# Patient Record
Sex: Female | Born: 2000 | Race: Black or African American | Hispanic: No | Marital: Single | Smoking: Never smoker
Health system: Southern US, Community
[De-identification: ages and names within clinical notes are randomized; demographics above are authoritative.]

## PROBLEM LIST (undated history)

## (undated) DIAGNOSIS — T7840XA Allergy, unspecified, initial encounter: Secondary | ICD-10-CM

## (undated) DIAGNOSIS — L309 Dermatitis, unspecified: Secondary | ICD-10-CM

## (undated) HISTORY — DX: Allergy, unspecified, initial encounter: T78.40XA

---
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
## 2019-05-14 DIAGNOSIS — L2084 Intrinsic (allergic) eczema: Secondary | ICD-10-CM | POA: Diagnosis not present

## 2019-05-15 NOTE — Progress Notes (Signed)
Subjective:    Patient ID: Holly Velez, female    DOB: 05/07/2000, 19 y.o.   MRN: [id number]  HPI:  Holly Velez is here to establish as a new pt.  She is a pleasant 19 year old female. PMH:  Seasonal allergies- treated with OTC Loratadine 10mg  QD She also reports "heart fluttering" that has increased the last 12 months (initially first started at age 39). She reports fluttering will occur twice monthly, last a few seconds. Sx's will more commonly occur in afternoon. She reports drinking only one cup of coffee/day. She reports hx of chronic anxiety. She has never been on medication for GAD. She denies chest pain or tightness with exertion. She denies family hx of CAD/MI/CVA, however her mother is experiencing similar sx's and is pursuing a cardiac workup. She practices yoga daily. She denies tobacco/vape/ETOH use. She reports feeling anxious at this appt, and states "I have never been to the doctor without my mother before". She denies palpitations at present   Patient Care Team    Relationship Specialty Notifications Start End  4, NP PCP - General Family Medicine  05/16/19     Patient Active Problem List   Diagnosis Date Noted  . Healthcare maintenance 05/16/2019  . Intermittent palpitations 05/16/2019     Past Medical History:  Diagnosis Date  . Allergy   . Eczema      History reviewed. No pertinent surgical history.   Family History  Problem Relation Age of Onset  . Hypertension Father   . Diabetes Maternal Grandmother      Social History   Substance and Sexual Activity  Drug Use No     Social History   Substance and Sexual Activity  Alcohol Use No     Social History   Tobacco Use  Smoking Status Never Smoker  Smokeless Tobacco Never Used     Outpatient Encounter Medications as of 05/16/2019  Medication Sig  . loratadine (CLARITIN) 10 MG tablet Take 10 mg by mouth daily.  . Multiple Vitamin (MULTIVITAMIN) tablet Take 1  tablet by mouth daily.  . [DISCONTINUED] cetirizine (ZYRTEC) 5 MG tablet Take 5 mg by mouth daily as needed. For allergies  . [DISCONTINUED] HYDRALAZINE HCL PO Take 10 mLs by mouth 3 (three) times daily.  . [DISCONTINUED] Olopatadine HCl (PATADAY OP) Place 1 drop into both eyes daily.  . [DISCONTINUED] prednisoLONE (ORAPRED) 15 MG/5ML solution 15 mls po qd days 1-2, 12 mls po days 3-4, 9 mls po days 5-6, 6 mls po days 7-8, 3 mls po days 9-10, then stop  . [DISCONTINUED] PRESCRIPTION MEDICATION Take by mouth 3 (three) times daily. antiobiotic started a week ago. For 1 week. Mom does not know name and pharmacy is closed. Doesn't know for how long was told to quit when all  Bottled where finished. Said the name of med begins with "L"  . [DISCONTINUED] PRESCRIPTION MEDICATION Apply 1 application topically daily as needed. For itching of rash. Prescription steroid cream. Mom does not know name of it   No facility-administered encounter medications on file as of 05/16/2019.    Allergies: Patient has no known allergies.  Body mass index is 31.92 kg/m.  Blood pressure 118/77, pulse 92, temperature 99.2 F (37.3 C), temperature source Oral, height 5' 0.5" (1.537 m), weight 166 lb 3.2 oz (75.4 kg), last menstrual period 05/13/2019, SpO2 100 %.     Review of Systems  Constitutional: Negative for activity change, appetite change, chills, diaphoresis, fatigue, fever  and unexpected weight change.  HENT: Negative for congestion.   Eyes: Negative for visual disturbance.  Respiratory: Negative for cough, chest tightness, shortness of breath, wheezing and stridor.   Cardiovascular: Positive for palpitations. Negative for chest pain and leg swelling.  Gastrointestinal: Negative for abdominal distention, abdominal pain, blood in stool, constipation, diarrhea, nausea and vomiting.  Endocrine: Negative for polydipsia, polyphagia and polyuria.  Genitourinary: Negative for difficulty urinating and frequency.   Musculoskeletal: Negative for arthralgias, back pain, gait problem, joint swelling, myalgias, neck pain and neck stiffness.  Neurological: Negative for dizziness and headaches.  Hematological: Negative for adenopathy. Does not bruise/bleed easily.  Psychiatric/Behavioral: Negative for agitation, behavioral problems, confusion, decreased concentration, dysphoric mood, hallucinations, self-injury, sleep disturbance and suicidal ideas. The patient is nervous/anxious. The patient is not hyperactive.        Objective:   Physical Exam Vitals and nursing note reviewed.  Constitutional:      General: She is not in acute distress.    Appearance: Normal appearance. She is obese. She is not ill-appearing, toxic-appearing or diaphoretic.  Cardiovascular:     Rate and Rhythm: Normal rate and regular rhythm.     Pulses: Normal pulses.     Heart sounds: Normal heart sounds. No murmur. No friction rub. No gallop.   Pulmonary:     Effort: Pulmonary effort is normal. No respiratory distress.     Breath sounds: Normal breath sounds. No stridor. No wheezing, rhonchi or rales.  Chest:     Chest wall: No tenderness.  Skin:    Capillary Refill: Capillary refill takes less than 2 seconds.  Neurological:     Mental Status: She is alert and oriented to person, place, and time.     Coordination: Coordination normal.  Psychiatric:        Attention and Perception: Attention normal.        Mood and Affect: Mood is anxious.        Speech: Speech is rapid and pressured.        Behavior: Behavior normal.        Thought Content: Thought content normal.        Cognition and Memory: Cognition and memory normal.        Judgment: Judgment normal.        Assessment & Plan:   1. Fluttering sensation of heart   2. Healthcare maintenance   3. Intermittent palpitations     Intermittent palpitations TSH, Free T4, T3, CMP, CBC drawn Avoid caffeine Continue daily yoga Holter Study ordered Discussed Red Flag  sx's to seek immediate medical assistance if any develop.  Healthcare maintenance We will call you when lab results are available. Holter Study order- we will contact you with results. Continue to social distance and wear a mask. Follow-up 6 weeks, re: palpitations.    FOLLOW-UP:  Return in 6 weeks (on 06/27/2019) for Palpitations.

## 2019-05-16 ENCOUNTER — Telehealth: Payer: Self-pay | Admitting: Radiology

## 2019-05-16 ENCOUNTER — Encounter: Payer: Self-pay | Admitting: Adult Health

## 2019-05-16 ENCOUNTER — Other Ambulatory Visit: Payer: Self-pay

## 2019-05-16 ENCOUNTER — Telehealth: Payer: Self-pay | Admitting: *Deleted

## 2019-05-16 ENCOUNTER — Ambulatory Visit (INDEPENDENT_AMBULATORY_CARE_PROVIDER_SITE_OTHER): Payer: BC Managed Care – PPO | Admitting: Adult Health

## 2019-05-16 VITALS — BP 118/77 | HR 92 | Temp 99.2°F | Ht 60.5 in | Wt 166.2 lb

## 2019-05-16 DIAGNOSIS — Z0000 Encounter for general adult medical examination without abnormal findings: Secondary | ICD-10-CM

## 2019-05-16 DIAGNOSIS — R002 Palpitations: Secondary | ICD-10-CM | POA: Insufficient documentation

## 2019-05-16 NOTE — Telephone Encounter (Signed)
error 

## 2019-05-16 NOTE — Telephone Encounter (Signed)
Patient enrolled for [employer] to mail a 3 day ZIO XT long term holter monitor.  Instructions reviewed briefly as they are included in the monitor kit.

## 2019-05-16 NOTE — Assessment & Plan Note (Signed)
TSH, Free T4, T3, CMP, CBC drawn Avoid caffeine Continue daily yoga Holter Study ordered Discussed Red Flag sx's to seek immediate medical assistance if any develop.

## 2019-05-16 NOTE — Patient Instructions (Addendum)
Mediterranean Diet A Mediterranean diet refers to food and lifestyle choices that are based on the traditions of countries located on the [employer]. This way of eating has been shown to help prevent certain conditions and improve outcomes for people who have chronic diseases, like kidney disease and heart disease. What are tips for following this plan? Lifestyle  Cook and eat meals together with your family, when possible.  Drink enough fluid to keep your urine clear or pale yellow.  Be physically active every day. This includes: ? Aerobic exercise like running or swimming. ? Leisure activities like gardening, walking, or housework.  Get 7-8 hours of sleep each night.  Reading food labels   Check the serving size of packaged foods. For foods such as rice and pasta, the serving size refers to the amount of cooked product, not dry.  Check the total fat in packaged foods. Avoid foods that have saturated fat or trans fats.  Check the ingredients list for added sugars, such as corn syrup. Shopping  At the grocery store, buy most of your food from the areas near the walls of the store. This includes: ? Fresh fruits and vegetables (produce). ? Grains, beans, nuts, and seeds. Some of these may be available in unpackaged forms or large amounts (in bulk). ? Fresh seafood. ? Poultry and eggs. ? Low-fat dairy products.  Buy whole ingredients instead of prepackaged foods.  Buy fresh fruits and vegetables in-season from local farmers markets.  Buy frozen fruits and vegetables in resealable bags.  If you do not have access to quality fresh seafood, buy precooked frozen shrimp or canned fish, such as tuna, salmon, or sardines.  Buy small amounts of raw or cooked vegetables, salads, or olives from the deli or salad bar at your store.  Stock your pantry so you always have certain foods on hand, such as olive oil, canned tuna, canned tomatoes, rice, pasta, and beans. Cooking  Cook  foods with extra-virgin olive oil instead of using butter or other vegetable oils.  Have meat as a side dish, and have vegetables or grains as your main dish. This means having meat in small portions or adding small amounts of meat to foods like pasta or stew.  Use beans or vegetables instead of meat in common dishes like chili or lasagna.  Experiment with different cooking methods. Try roasting or broiling vegetables instead of steaming or sauteing them.  Add frozen vegetables to soups, stews, pasta, or rice.  Add nuts or seeds for added healthy fat at each meal. You can add these to yogurt, salads, or vegetable dishes.  Marinate fish or vegetables using olive oil, lemon juice, garlic, and fresh herbs. Meal planning   Plan to eat 1 vegetarian meal one day each week. Try to work up to 2 vegetarian meals, if possible.  Eat seafood 2 or more times a week.  Have healthy snacks readily available, such as: ? Vegetable sticks with hummus. ? [place] yogurt. ? Fruit and nut trail mix.  Eat balanced meals throughout the week. This includes: ? Fruit: 2-3 servings a day ? Vegetables: 4-5 servings a day ? Low-fat dairy: 2 servings a day ? Fish, poultry, or lean meat: 1 serving a day ? Beans and legumes: 2 or more servings a week ? Nuts and seeds: 1-2 servings a day ? Whole grains: 6-8 servings a day ? Extra-virgin olive oil: 3-4 servings a day  Limit red meat and sweets to only a few servings a month What  are my food choices?  Mediterranean diet ? Recommended  Grains: Whole-grain pasta. Brown rice. Bulgar wheat. Polenta. Couscous. Whole-wheat bread. Orpah Cobb.  Vegetables: Artichokes. Beets. Broccoli. Cabbage. Carrots. Eggplant. Green beans. Chard. Kale. Spinach. Onions. Leeks. Peas. Squash. Tomatoes. Peppers. Radishes.  Fruits: Apples. Apricots. Avocado. Berries. Bananas. Cherries. Dates. Figs. Grapes. Lemons. Melon. Oranges. Peaches. Plums. Pomegranate.  Meats and other  protein foods: Beans. Almonds. Sunflower seeds. Pine nuts. Peanuts. Cod. Salmon. Scallops. Shrimp. Tuna. Tilapia. Clams. Oysters. Eggs.  Dairy: Low-fat milk. Cheese. Greek yogurt.  Beverages: Water. Red wine. Herbal tea.  Fats and oils: Extra virgin olive oil. Avocado oil. Grape seed oil.  Sweets and desserts: [place] yogurt with honey. Baked apples. Poached pears. Trail mix.  Seasoning and other foods: Basil. Cilantro. Coriander. Cumin. Mint. Parsley. Sage. Rosemary. Tarragon. Garlic. Oregano. Thyme. Pepper. Balsalmic vinegar. Tahini. Hummus. Tomato sauce. Olives. Mushrooms. ? Limit these  Grains: Prepackaged pasta or rice dishes. Prepackaged cereal with added sugar.  Vegetables: Deep fried potatoes (french fries).  Fruits: Fruit canned in syrup.  Meats and other protein foods: Beef. Pork. Lamb. Poultry with skin. Hot dogs. Tomasa Blase.  Dairy: Ice cream. Sour cream. Whole milk.  Beverages: Juice. Sugar-sweetened soft drinks. Beer. Liquor and spirits.  Fats and oils: Butter. Canola oil. Vegetable oil. Beef fat (tallow). Lard.  Sweets and desserts: Cookies. Cakes. Pies. Candy.  Seasoning and other foods: Mayonnaise. Premade sauces and marinades. The items listed may not be a complete list. Talk with your dietitian about what dietary choices are right for you. Summary  The Mediterranean diet includes both food and lifestyle choices.  Eat a variety of fresh fruits and vegetables, beans, nuts, seeds, and whole grains.  Limit the amount of red meat and sweets that you eat.  This information is not intended to replace advice given to you by your health care provider. Make sure you discuss any questions you have with your health care provider. Document Revised: 11/26/2015 Document Reviewed: 11/19/2015 Elsevier Patient Education  2020 [employer].   Palpitations Palpitations are feelings that your heartbeat is not normal. Your heartbeat may feel like it is:  Uneven.  Faster than  normal.  Fluttering.  Skipping a beat. This is usually not a serious problem. In some cases, you may need tests to rule out any serious problems. Follow these instructions at home: Pay attention to any changes in your condition. Take these actions to help manage your symptoms: Eating and drinking  Avoid: ? Coffee, tea, soft drinks, and energy drinks. ? Chocolate. ? Alcohol. ? Diet pills. Lifestyle   Try to lower your stress. These things can help you relax: ? Yoga. ? Deep breathing and meditation. ? Exercise. ? Using words and images to create positive thoughts (guided imagery). ? Using your mind to control things in your body (biofeedback).  Do not use drugs.  Get plenty of rest and sleep. Keep a regular bed time. General instructions   Take over-the-counter and prescription medicines only as told by your doctor.  Do not use any products that contain nicotine or tobacco, such as cigarettes and e-cigarettes. If you need help quitting, ask your doctor.  Keep all follow-up visits as told by your doctor. This is important. You may need more tests if palpitations do not go away or get worse. Contact a doctor if:  Your symptoms last more than 24 hours.  Your symptoms occur more often. Get help right away if you:  Have chest pain.  Feel short of breath.  Have  a very bad headache.  Feel dizzy.  Pass out (faint). Summary  Palpitations are feelings that your heartbeat is uneven or faster than normal. It may feel like your heart is fluttering or skipping a beat.  Avoid food and drinks that may cause palpitations. These include caffeine, chocolate, and alcohol.  Try to lower your stress. Do not smoke or use drugs.  Get help right away if you faint or have chest pain, shortness of breath, a severe headache, or dizziness. This information is not intended to replace advice given to you by your health care provider. Make sure you discuss any questions you have with your  health care provider. Document Revised: 05/10/2017 Document Reviewed: 05/10/2017 Elsevier Patient Education  [employer].  We will call you when lab results are available. Holter Study order- we will contact you with results. Continue to social distance and wear a mask. Follow-up 6 weeks, re: palpitations. WELCOME TO THE PRACTICE!

## 2019-05-16 NOTE — Telephone Encounter (Deleted)
Enrolled patient for a 3 day Zio monitor to be mailed to patients home. Brief instructions were gone over with patient and she knows to expect the monitor to arrive in 5-7 days.

## 2019-05-17 LAB — CBC WITH DIFFERENTIAL/PLATELET
Basophils Absolute: 0 10*3/uL (ref 0.0–0.2)
Basos: 0 %
EOS (ABSOLUTE): 0.5 10*3/uL — ABNORMAL HIGH (ref 0.0–0.4)
Eos: 5 %
Hematocrit: 39 % (ref 34.0–46.6)
Hemoglobin: 12.9 g/dL (ref 11.1–15.9)
Immature Grans (Abs): 0 10*3/uL (ref 0.0–0.1)
Immature Granulocytes: 0 %
Lymphocytes Absolute: 3.1 10*3/uL (ref 0.7–3.1)
Lymphs: 30 %
MCH: 28.5 pg (ref 26.6–33.0)
MCHC: 33.1 g/dL (ref 31.5–35.7)
MCV: 86 fL (ref 79–97)
Monocytes Absolute: 0.8 10*3/uL (ref 0.1–0.9)
Monocytes: 8 %
Neutrophils Absolute: 6 10*3/uL (ref 1.4–7.0)
Neutrophils: 57 %
Platelets: 375 10*3/uL (ref 150–450)
RBC: 4.53 x10E6/uL (ref 3.77–5.28)
RDW: 12.2 % (ref 11.7–15.4)
WBC: 10.5 10*3/uL (ref 3.4–10.8)

## 2019-05-17 LAB — COMPREHENSIVE METABOLIC PANEL
ALT: 13 IU/L (ref 0–32)
AST: 18 IU/L (ref 0–40)
Albumin/Globulin Ratio: 1.7 (ref 1.2–2.2)
Albumin: 4.2 g/dL (ref 3.9–5.0)
Alkaline Phosphatase: 74 IU/L (ref 39–117)
BUN/Creatinine Ratio: 17 (ref 9–23)
BUN: 12 mg/dL (ref 6–20)
Bilirubin Total: 0.3 mg/dL (ref 0.0–1.2)
CO2: 24 mmol/L (ref 20–29)
Calcium: 9.8 mg/dL (ref 8.7–10.2)
Chloride: 102 mmol/L (ref 96–106)
Creatinine, Ser: 0.69 mg/dL (ref 0.57–1.00)
GFR calc Af Amer: 146 mL/min/{1.73_m2} (ref >59)
GFR calc non Af Amer: 127 mL/min/{1.73_m2} (ref >59)
Globulin, Total: 2.5 g/dL (ref 1.5–4.5)
Glucose: 83 mg/dL (ref 65–99)
Potassium: 4.2 mmol/L (ref 3.5–5.2)
Sodium: 140 mmol/L (ref 134–144)
Total Protein: 6.7 g/dL (ref 6.0–8.5)

## 2019-05-17 LAB — T4, FREE: Free T4: 1.05 ng/dL (ref 0.93–1.60)

## 2019-05-17 LAB — T3: T3, Total: 148 ng/dL (ref 71–180)

## 2019-05-17 LAB — TSH: TSH: 1.56 u[IU]/mL (ref 0.450–4.500)

## 2019-05-17 NOTE — Assessment & Plan Note (Signed)
We will call you when lab results are available. Holter Study order- we will contact you with results. Continue to social distance and wear a mask. Follow-up 6 weeks, re: palpitations.

## 2019-05-20 ENCOUNTER — Other Ambulatory Visit (INDEPENDENT_AMBULATORY_CARE_PROVIDER_SITE_OTHER): Payer: BC Managed Care – PPO

## 2019-05-20 DIAGNOSIS — R002 Palpitations: Secondary | ICD-10-CM | POA: Diagnosis not present

## 2019-06-28 ENCOUNTER — Other Ambulatory Visit: Payer: Self-pay

## 2019-06-28 ENCOUNTER — Encounter: Payer: Self-pay | Admitting: Family Medicine

## 2019-06-28 ENCOUNTER — Ambulatory Visit (INDEPENDENT_AMBULATORY_CARE_PROVIDER_SITE_OTHER): Payer: BC Managed Care – PPO | Admitting: Family Medicine

## 2019-06-28 VITALS — BP 135/89 | HR 84 | Temp 98.3°F | Resp 12 | Ht 61.0 in | Wt 172.6 lb

## 2019-06-28 DIAGNOSIS — R002 Palpitations: Secondary | ICD-10-CM

## 2019-06-28 DIAGNOSIS — F4322 Adjustment disorder with anxiety: Secondary | ICD-10-CM | POA: Diagnosis not present

## 2019-06-28 DIAGNOSIS — F15980 Other stimulant use, unspecified with stimulant-induced anxiety disorder: Secondary | ICD-10-CM | POA: Insufficient documentation

## 2019-06-28 DIAGNOSIS — F439 Reaction to severe stress, unspecified: Secondary | ICD-10-CM

## 2019-06-28 DIAGNOSIS — J3089 Other allergic rhinitis: Secondary | ICD-10-CM | POA: Insufficient documentation

## 2019-06-28 MED ORDER — AZELASTINE HCL 0.1 % NA SOLN: NASAL | 5 refills | Status: AC

## 2019-06-28 NOTE — Patient Instructions (Addendum)
Stop use of antihistamines (allergy meds), including benadryl, Claritin, zyrtec, Allegra, etc.  Stop all intake of caffeine, including energy drinks, coffee, sweet tea, etc.  Drink one half of your body weight in ounces of water per day (86 ounces daily).  Engage in at least 30-45 minutes of higher-intensity cardiovascular activity daily, such as dancing, brisk walking or jogging.  Continue yoga for 30-45 minutes per day.  For meditation, look into the use of the Calm, Headspace, Ten Percent, or Breathe app. Engage in two sessions of deep breathing daily.     Managing Anxiety, Adult After being diagnosed with an anxiety disorder, you may be relieved to know why you have felt or behaved a certain way. You may also feel overwhelmed about the treatment ahead and what it will mean for your life. With care and support, you can manage this condition and recover from it. How to manage lifestyle changes Managing stress and anxiety  Stress is your body's reaction to life changes and events, both good and bad. Most stress will last just a few hours, but stress can be ongoing and can lead to more than just stress. Although stress can play a major role in anxiety, it is not the same as anxiety. Stress is usually caused by something external, such as a deadline, test, or competition. Stress normally passes after the triggering event has ended.  Anxiety is caused by something internal, such as imagining a terrible outcome or worrying that something will go wrong that will devastate you. Anxiety often does not go away even after the triggering event is over, and it can become long-term (chronic) worry. It is important to understand the differences between stress and anxiety and to manage your stress effectively so that it does not lead to an anxious response. Talk with your health care provider or a counselor to learn more about reducing anxiety and stress. He or she may suggest tension reduction techniques,  such as:  Music therapy. This can include creating or listening to music that you enjoy and that inspires you.  Mindfulness-based meditation. This involves being aware of your normal breaths while not trying to control your breathing. It can be done while sitting or walking.  Centering prayer. This involves focusing on a word, phrase, or sacred image that means something to you and brings you peace.  Deep breathing. To do this, expand your stomach and inhale slowly through your nose. Hold your breath for 3-5 seconds. Then exhale slowly, letting your stomach muscles relax.  Self-talk. This involves identifying thought patterns that lead to anxiety reactions and changing those patterns.  Muscle relaxation. This involves tensing muscles and then relaxing them. Choose a tension reduction technique that suits your lifestyle and personality. These techniques take time and practice. Set aside 5-15 minutes a day to do them. Therapists can offer counseling and training in these techniques. The training to help with anxiety may be covered by some insurance plans. Other things you can do to manage stress and anxiety include:  Keeping a stress/anxiety diary. This can help you learn what triggers your reaction and then learn ways to manage your response.  Thinking about how you react to certain situations. You may not be able to control everything, but you can control your response.  Making time for activities that help you relax and not feeling guilty about spending your time in this way.  Visual imagery and yoga can help you stay calm and relax.  Medicines Medicines can help ease symptoms.  Medicines for anxiety include:  Anti-anxiety drugs.  Antidepressants. Medicines are often used as a primary treatment for anxiety disorder. Medicines will be prescribed by a health care provider. When used together, medicines, psychotherapy, and tension reduction techniques may be the most effective  treatment. Relationships Relationships can play a big part in helping you recover. Try to spend more time connecting with trusted friends and family members. Consider going to couples counseling, taking family education classes, or going to family therapy. Therapy can help you and others better understand your condition. How to recognize changes in your anxiety Everyone responds differently to treatment for anxiety. Recovery from anxiety happens when symptoms decrease and stop interfering with your daily activities at home or work. This may mean that you will start to:  Have better concentration and focus. Worry will interfere less in your daily thinking.  Sleep better.  Be less irritable.  Have more energy.  Have improved memory. It is important to recognize when your condition is getting worse. Contact your health care provider if your symptoms interfere with home or work and you feel like your condition is not improving. Follow these instructions at home: Activity  Exercise. Most adults should do the following: ? Exercise for at least 150 minutes each week. The exercise should increase your heart rate and make you sweat (moderate-intensity exercise). ? Strengthening exercises at least twice a week.  Get the right amount and quality of sleep. Most adults need 7-9 hours of sleep each night. Lifestyle   Eat a healthy diet that includes plenty of vegetables, fruits, whole grains, low-fat dairy products, and lean protein. Do not eat a lot of foods that are high in solid fats, added sugars, or salt.  Make choices that simplify your life.  Do not use any products that contain nicotine or tobacco, such as cigarettes, e-cigarettes, and chewing tobacco. If you need help quitting, ask your health care provider.  Avoid caffeine, alcohol, and certain over-the-counter cold medicines. These may make you feel worse. Ask your pharmacist which medicines to avoid. General instructions  Take  over-the-counter and prescription medicines only as told by your health care provider.  Keep all follow-up visits as told by your health care provider. This is important. Where to find support You can get help and support from these sources:  Self-help groups.  Online and [employer].  A trusted spiritual leader.  Couples counseling.  Family education classes.  Family therapy. Where to find more information You may find that joining a support group helps you deal with your anxiety. The following sources can help you locate counselors or support groups near you:  Mental Health America: www.mentalhealthamerica.net  Anxiety and Depression Association of [place] (ADAA): ProgramCam.de  [employer] Mental Illness (NAMI): www.nami.org Contact a health care provider if you:  Have a hard time staying focused or finishing daily tasks.  Spend many hours a day feeling worried about everyday life.  Become exhausted by worry.  Start to have headaches, feel tense, or have nausea.  Urinate more than normal.  Have diarrhea. Get help right away if you have:  A racing heart and shortness of breath.  Thoughts of hurting yourself or others. If you ever feel like you may hurt yourself or others, or have thoughts about taking your own life, get help right away. You can go to your nearest emergency department or call:  Your local emergency services (911 in the [place]  A suicide crisis helpline, such as the National Suicide Prevention  Lifeline at 959-521-8054. This is open 24 hours a day. Summary  Taking steps to learn and use tension reduction techniques can help calm you and help prevent triggering an anxiety reaction.  When used together, medicines, psychotherapy, and tension reduction techniques may be the most effective treatment.  Family, friends, and partners can play a big part in helping you recover from an anxiety disorder. This information is not  intended to replace advice given to you by your health care provider. Make sure you discuss any questions you have with your health care provider. Document Revised: 08/28/2018 Document Reviewed: 08/28/2018 Elsevier Patient Education  [place].

## 2019-06-28 NOTE — Progress Notes (Signed)
Impression and Recommendations:    1. Heart palpitations- lasting seconds only, with no accompanying sx   2. Caffeine-induced anxiety disorder (HCC)   3. Adjustment disorder with anxious mood   4. Stress at home- took on 20yo neice since aunt recently passed away   5. Environmental and seasonal allergies      Of note, this is my first time meeting patient.  Patient is new to me and was previously being cared for at our office by Holly Hamburger, NP, who no longer works at primary care [employer].  - Patient established on 05/16/2019, and was seen for heart flutter at that time.  Heart Palpitations - lasting seconds only, w/ no accompanying sx - Lab work obtained 05/16/2019; thyroid WNL, not anemic, no electrolyte abnormalities. - Holter study ordered last OV, and patient was told to follow up in 6 weeks.  - Discussed patient's recent Holter study during appointment today. - Advised patient that no significant arrhythmias or abnormalities were found.  - Patient knows to monitor herself for red flag cardiac symptoms, and seek immediate medical assistance if any concerning symptoms develop.  - Patient knows to ask for referral to cardiology if desired.  - Will continue to monitor.   Caffeine-induced anxiety disorder - Advised patient to completely stop using caffeine.  - Told patient to discontinue drinking sweet tea, coffee, and energy drinks.  Advised patient to drink water only, about 86 ounces per day, and avoid sugared beverages such as apple juice or orange juice.  - Will continue to monitor.   Environmental and seasonal allergies - Advised patient to completely stop using antihistamines such as Claritin, Allegra, Benadryl.   - For allergy control, advised the patient to begin using AYR or Neilmed sinus rinses BID followed by Astelin nasal spray BID (one spray to each nostril).  - Will continue to monitor.  Stress at home - took on 50 year old niece since aunt recently  passed away, adjustment disorder w/ anxious mood - Discussed that patient's heart palpitations are likely exacerbated by anxiety.    - Reviewed the "spokes of the wheel" of health and anxiety management.  Stressed the importance of ongoing prudent habits, including regular exercise, appropriate sleep hygiene, healthful dietary habits, and prayer/meditation to relax.  - Advised patient to continue working toward exercising to reduce stress and improve overall wellbeing.  - Encouraged patient to begin 10 minutes of meditation BID, and continue 30-45 minutes of yoga per day.  Encouraged patient to include her mother in her stress relief activities.  - Advised patient to seek therapy/counseling through the resources at her school, [employer].  - Encouraged patient to engage in daily physical activity, especially a formal exercise routine.  Recommended that the patient eventually strive for at least 150 minutes of moderate cardiovascular activity per week according to guidelines established by the [facility].   - Healthy dietary habits encouraged, including low-carb, and high amounts of lean protein in diet.    Recommendations - Return in 1 month for follow-up.    Orders Placed This Encounter  Procedures  . EKG 12-Lead    Meds ordered this encounter  Medications  . azelastine (ASTELIN) 0.1 % nasal spray    Sig: 1 spray each nostril twice daily after neil med sinus rinses for allergies    Dispense:  30 mL    Refill:  5    Medications Discontinued During This Encounter  Medication Reason  . loratadine (CLARITIN) 10 MG tablet Discontinued  by provider      Please see AVS handed out to patient at the end of our visit for further patient instructions/ counseling done pertaining to today's office visit.   Return for f/up 1 month for progress after discontinuing caffiene, stopping antihistamines.     Note:  This note was prepared with assistance of Dragon voice recognition software. Occasional  wrong-word or sound-a-like substitutions may have occurred due to the inherent limitations of voice recognition software.   The [place] was signed into law in 2016 which includes the topic of electronic health records.  This provides immediate access to information in MyChart.  This includes consultation notes, operative notes, office notes, lab results and pathology reports.  If you have any questions about what you read please let us know at your next visit or call us at the office.  We are right here with you.   This case required medical decision making of at least moderate complexity.  This document serves as a record of services personally performed by Holly Dance, DO. It was created on her behalf by Holly Velez, a trained medical scribe. The creation of this record is based on the scribe's personal observations and the provider's statements to them.   The above documentation from Holly Velez, medical scribe, has been reviewed by Holly Velez, D.O.  --------------------------------------------------------------------------------------------------------------------------------------------------------------------------------------------------------------------------------------------    Subjective:     Holly Velez, am serving as scribe for Dr.Yu Velez.   HPI: Holly Velez is a 19 y.o. female who presents to [place] [facility] today for issues as discussed below.  - Heart Palpitations Last OV, patient reported "heart fluttering" that has increased in the last 12 months (initially first started at age 23).  Patient denies family history of CAD/MI/CVA, however her mother is experiencing similar symptoms and is pursuing a cardiac workup.  Since last OV, patient states she hasn't had any palpitations this week, "but the day after I took the [monitor] off, I had another [palpitation], and then I had another one the  next day, but it was worse than usual."  Notes her palpitations usually last for one second, but the palpitation that concerned her lasted for eight seconds.  She has not cut caffeine out of her diet since last OV.  States "I need caffeine."  She typically drinks V8 energy, which has 80 mg of caffeine in it.  During the day, she drinks water, apple juice, and Naked smoothies.  She also drinks coffee and sweet tea now and then.  Confirms that she takes Claritin daily.  Her allergies are worse when she's around dogs or pollen.  - Anxiety As a Electronics engineer, she gets anxious around exams.  She attends [employer].  She engages in yoga daily, for thirty minutes on average, "sometimes less, sometimes more."  For cardio, sometimes she goes running with her sister, for about 20 minutes at a time.  - Stress at Home Patient notes that her mother is more stressed lately due to stress at work, and in addition, due to the patient's aunt recently passing away, they have taken in a 42 year old child.  The patient's younger biological sibling is 39.    Wt Readings from Last 3 Encounters:  06/28/19 172 lb 9.6 oz (78.3 kg) (93 %, Z= 1.46)*  05/16/19 166 lb 3.2 oz (75.4 kg) (91 %, Z= 1.33)*  10/12/11 75 lb (34 kg) (19 %, Z= -0.87)*   * Growth percentiles are  based on CDC (Girls, 2-20 Years) data.   BP Readings from Last 3 Encounters:  2019-07-20 135/89  05/16/19 118/77  10/12/11 (!) 135/87   Pulse Readings from Last 3 Encounters:  07/20/2019 84  05/16/19 92  10/12/11 90   BMI Readings from Last 3 Encounters:  Jul 20, 2019 32.61 kg/m (96 %, Z= 1.76)*  05/16/19 31.92 kg/m (96 %, Z= 1.71)*   * Growth percentiles are based on CDC (Girls, 2-20 Years) data.     Patient Care Team    Relationship Specialty Notifications Start End  Julaine Fusi, NP PCP - General Family Medicine  05/16/19      Patient Active Problem List   Diagnosis Date Noted  . Caffeine-induced anxiety disorder (HCC) 07/20/2019  .  Adjustment disorder with anxious mood July 20, 2019  . Environmental and seasonal allergies 2019-07-20  . Stress at home- took on 20yo neice since aunt recently passed away 07/20/2019  . Healthcare maintenance 05/16/2019  . Intermittent palpitations 05/16/2019    Past Medical history, Surgical history, Family history, Social history, Allergies and Medications have been entered into the medical record, reviewed and changed as needed.    Current Meds  Medication Sig  . Multiple Vitamin (MULTIVITAMIN) tablet Take 1 tablet by mouth daily.  . [DISCONTINUED] loratadine (CLARITIN) 10 MG tablet Take 10 mg by mouth daily.    Allergies:  No Known Allergies   Review of Systems:  A fourteen system review of systems was performed and found to be positive as per HPI.   Objective:   Blood pressure 135/89, pulse 84, temperature 98.3 F (36.8 C), temperature source Oral, resp. rate 12, height 5\' 1"  (1.549 m), weight 172 lb 9.6 oz (78.3 kg), last menstrual period 05/31/2019, SpO2 98 %. Body mass index is 32.61 kg/m. General:  Well Developed, well nourished, appropriate for stated age.  Neuro:  Alert and oriented,  extra-ocular muscles intact  HEENT:  Normocephalic, atraumatic, neck supple, no carotid bruits appreciated  Skin:  no gross rash, warm, pink. Cardiac:  RRR, S1 S2 Respiratory:  ECTA B/L and A/P, Not using accessory muscles, speaking in full sentences- unlabored. Vascular:  Ext warm, no cyanosis apprec.; cap RF less 2 sec. Psych:  No HI/SI, judgement and insight good, Euthymic mood. Full Affect.

## 2019-08-02 ENCOUNTER — Other Ambulatory Visit: Payer: Self-pay

## 2019-08-02 ENCOUNTER — Ambulatory Visit (INDEPENDENT_AMBULATORY_CARE_PROVIDER_SITE_OTHER): Payer: BC Managed Care – PPO | Admitting: Family Medicine

## 2019-08-02 ENCOUNTER — Encounter: Payer: Self-pay | Admitting: Family Medicine

## 2019-08-02 VITALS — BP 125/76 | HR 73 | Temp 97.9°F | Ht 61.0 in | Wt 170.4 lb

## 2019-08-02 DIAGNOSIS — F15980 Other stimulant use, unspecified with stimulant-induced anxiety disorder: Secondary | ICD-10-CM

## 2019-08-02 DIAGNOSIS — F439 Reaction to severe stress, unspecified: Secondary | ICD-10-CM | POA: Diagnosis not present

## 2019-08-02 DIAGNOSIS — R002 Palpitations: Secondary | ICD-10-CM

## 2019-08-02 DIAGNOSIS — J3089 Other allergic rhinitis: Secondary | ICD-10-CM

## 2019-08-02 DIAGNOSIS — F4322 Adjustment disorder with anxiety: Secondary | ICD-10-CM

## 2019-08-02 NOTE — Progress Notes (Signed)
Impression and Recommendations:    1. Heart palpitations- lasting seconds only, with no accompanying sx   2. Fluttering sensation of heart   3. Caffeine-induced anxiety disorder (HCC)   4. Adjustment disorder with anxious mood   5. Stress at home- took on 20yo neice since aunt recently passed away   6. Environmental and seasonal allergies      Heart Palpitations - Discussed that patient's recent Holter monitor and EKG returned WNL.   - Per patient, since last OV, has engaged in lifestyle changes as advised. - Per patient, discontinued caffeine, discontinued antihistamines, and began meditation.  - To continue to mitigate the fight or flight response, advised patient to maintain lifestyle changes as established, including engaging in medication, avoiding caffeine, and avoiding antihistamines.  - Reviewed the "spokes of the wheel" of stress management.  Emphasized the importance of ongoing prudent habits, including regular exercise, appropriate sleep hygiene, healthful dietary habits, and prayer/meditation to relax.  - Will continue to monitor.   Environmental & Seasonal Allergies - For heart palpitations, patient discontinued antihistamines. - Per patient, symptoms well controlled on Astelin nasal spray.  - Advised the patient to begin using AYR or Neilmed sinus rinses BID followed by Astelin nasal spray BID (one spray to each nostril).   - Will continue to monitor.   Health Counseling & Preventative Maintenance - Advised patient to continue working toward exercising to improve overall mental, physical, and emotional health.    - Encouraged patient to engage in daily physical activity as tolerated, especially a formal exercise routine.  Recommended that the patient eventually strive for at least 150 minutes of moderate cardiovascular activity per week according to guidelines established by the [facility].   - Healthy dietary habits encouraged, including low-carb, and high  amounts of lean protein in diet.   - Patient should also consume adequate amounts of water.  Encouraged patient to hydrate adequately using sodium-free, calorie-free beverages.  - Health counseling performed.  All questions answered.   Recommendations - Advised patient to follow up with OBGYN. - Patient will return in 6-12 months for yearly physical and full fasting blood work same day at that time.   Please see AVS handed out to patient at the end of our visit for further patient instructions/ counseling done pertaining to today's office visit.   Return for CPE in 6-12 months will full fasting blood work same day, including FLP, Vit D, etc..     Note:  This note was prepared with assistance of Dragon voice recognition software. Occasional wrong-word or sound-a-like substitutions may have occurred due to the inherent limitations of voice recognition software.   The 21st Century Cures Act was signed into law in 2016 which includes the topic of electronic health records.  This provides immediate access to information in MyChart.  This includes consultation notes, operative notes, office notes, lab results and pathology reports.  If you have any questions about what you read please let us know at your next visit or call us at the office.  We are right here with you.   This case required medical decision making of at least moderate complexity.  This document serves as a record of services personally performed by Thomasene Lot, DO. It was created on her behalf by Peggye Fothergill, a trained medical scribe. The creation of this record is based on the scribe's personal observations and the provider's statements to them.    The above documentation from Peggye Fothergill, medical scribe, has  been reviewed by Marjory Sneddon,  D.O.       --------------------------------------------------------------------------------------------------------------------------------------------------------------------------------------------------------------------------------------------    Subjective:     [name], am serving as scribe for Dr.Little Bashore.   HPI: Holly Velez is a 19 y.o. female who presents to [place] at [facility] today for issues as discussed below.  - Heart Palpitations Patient reports that since last OV, she discontinued drinking sweet tea, coffee, energy drinks as advised.  She also discontinued using antihistamines.  Confirms that she had a bad headache daily for the first week of discontinuing caffeine, but this resolved.  Notes that making this lifestyle change was hard, but her heart palpitations have improved since then.  "I don't have any heart palpitations like I used to; I think I've only had one since the last time I saw you."  This palpitation did not last long.  She has also engaged in meditation; "not as much as I should have."  For stress management, she downloaded the Calm app.  There is an 20 year old newly in her house, and notes he has a lot of energy to deal with.  Since discontinuing the caffeine, notes she has had more energy "sometimes."  She now mainly drinks water or Sprite Zero.  Denies concerns with mood management today.  - Environmental & Seasonal Allergies Says that the nasal spray has been working to help with her allergies.    Depression screen [facility] 2/9 08/02/2019 07/12/19 05/16/2019  Decreased Interest 0 0 0  Down, Depressed, Hopeless 0 0 0  PHQ - 2 Score 0 0 0  Altered sleeping 0 0 0  Tired, decreased energy 0 0 0  Change in appetite 0 0 0  Feeling bad or failure about yourself  0 0 0  Trouble concentrating 0 0 0  Moving slowly or fidgety/restless 0 0 0  Suicidal thoughts 0 0 0  PHQ-9 Score 0 0 0   No flowsheet data  found.    Wt Readings from Last 3 Encounters:  08/02/19 170 lb 6.4 oz (77.3 kg) (92 %, Z= 1.41)*  2019/07/12 172 lb 9.6 oz (78.3 kg) (93 %, Z= 1.46)*  05/16/19 166 lb 3.2 oz (75.4 kg) (91 %, Z= 1.33)*   * Growth percentiles are based on CDC (Girls, 2-20 Years) data.   BP Readings from Last 3 Encounters:  08/02/19 125/76  07/12/19 135/89  05/16/19 118/77   Pulse Readings from Last 3 Encounters:  08/02/19 73  July 12, 2019 84  05/16/19 92   BMI Readings from Last 3 Encounters:  08/02/19 32.20 kg/m (96 %, Z= 1.72)*  2019-07-12 32.61 kg/m (96 %, Z= 1.76)*  05/16/19 31.92 kg/m (96 %, Z= 1.71)*   * Growth percentiles are based on CDC (Girls, 2-20 Years) data.     No care team member to display   Patient Active Problem List   Diagnosis Date Noted  . Caffeine-induced anxiety disorder ([place]) 07/12/19  . Adjustment disorder with anxious mood 07/12/2019  . Environmental and seasonal allergies July 12, 2019  . Stress at home- took on 20yo neice since aunt recently passed away Jul 12, 2019  . Healthcare maintenance 05/16/2019  . Intermittent palpitations 05/16/2019    Past Medical history, Surgical history, Family history, Social history, Allergies and Medications have been entered into the medical record, reviewed and changed as needed.    Current Meds  Medication Sig  . azelastine (ASTELIN) 0.1 % nasal spray 1 spray each nostril twice daily after neil med sinus rinses for allergies  .  Multiple Vitamin (MULTIVITAMIN) tablet Take 1 tablet by mouth daily.    Allergies:  No Known Allergies   Review of Systems:  A fourteen system review of systems was performed and found to be positive as per HPI.   Objective:   Blood pressure 125/76, pulse 73, temperature 97.9 F (36.6 C), temperature source Oral, height 5\' 1"  (1.549 m), weight 170 lb 6.4 oz (77.3 kg), SpO2 97 %. Body mass index is 32.2 kg/m. General:  Well Developed, well nourished, appropriate for stated age.  Neuro:  Alert  and oriented,  extra-ocular muscles intact  HEENT:  Normocephalic, atraumatic, neck supple, no carotid bruits appreciated  Skin:  no gross rash, warm, pink. Cardiac:  RRR, S1 S2 Respiratory:  ECTA B/L and A/P, Not using accessory muscles, speaking in full sentences- unlabored. Vascular:  Ext warm, no cyanosis apprec.; cap RF less 2 sec. Psych:  No HI/SI, judgement and insight good, Euthymic mood. Full Affect.

## 2019-08-02 NOTE — Patient Instructions (Signed)
  What is Chronic Stress Syndrome, Symptoms & Ways to Deal With it   What is Chronic Stress Syndrome?  Chronic Stress Syndrome is something which can now be called as a medical condition due to the amount of stress an individual is going through these days. Chronic Stress Syndrome causes the body and mind to shutdown and the person has no control over himself or herself. Due to the demands of modern day life and the hardship throughout day and night takes its toll over a period of time and the body and brain starts demanding rest and a break. This leads to certain symptoms where your performance level starts to dip at work, you become irritable both at work and at home, you may stop enjoying activities you previously liked, you may become depressed, you may get angry for even small things. Chronic Stress Syndrome can significantly impact your quality life. Thus it is important understand the symptoms of Chronic Stress Syndrome and react accordingly in order to cope up with it.  It is important to note here that a balanced work-home equation should be drawn to cut down symptoms of Chronic Stress Syndrome. Minor stressors can be overcome by the body's inbuilt stress response but when there is unending stress for a long period of time then an external help is required to ease the stress.  Chronic Stress Syndrome can physically and psychologically drain you over a period of time. For such cases stress management is the best way to cope up with Chronic Stress Syndrome. If Chronic Stress Syndrome is not treated then it may result in many health hazards like anxiety, muscle pain, insomnia, and high blood pressure along with a compromised immune system leading to frequent infections and missed days from work.    What are the Symptoms of Chronic Stress Syndrome?   The symptoms of Chronic Stress Syndrome are variable and range from generalized symptoms to emotional symptoms along with behavioral and  cognitive symptoms. Some of these symptoms have been delineated below:  Generalized Symptoms of Chronic Stress Syndrome are: Anxiety Depression Social isolation Headache Abdominal pain Lack of sleep Back pain Difficulty in concentrating Hypertension Hemorrhoids Varicose veins Panic attacks/ Panic disorder Cardiovascular diseases.   Some of the Emotional Symptoms of Chronic Stress Syndrome are: To become easily agitated, moody and frustrated Feeling overwhelmed which makes you feel like you are losing control. Having difficulty relaxing and have a peaceful mind Having low self esteem Feeling lonely Feeling worthless Feeling depressed Avoiding social environment.   Some of the Physical Symptoms of Chronic Stress Syndrome are: Headaches Lethargy Alternating diarrhea and constipation Nausea Muscles aches and pains Insomnia Rapid heartbeat and chest pain Infections and frequent colds Decreased libido Nervousness and shaking Tinnitus Sweaty palms Dry mouth Clenched jaw.  Some of the Cognitive Symptoms of Chronic Stress Syndrome are: Constant worrying Racing thoughts Disorganization and forgetfulness Inability to focus Poor judgment Abundance of negativity.  Some of the Behavioral Symptoms of Chronic Stress Syndrome are: Changes in appetite with less desire to eat Avoiding responsibilities Indulgence in alcohol or recreational drug use Increased nail biting and being fidgety Ways to Deal With Chronic Stress Syndrome    Chronic Stress Syndrome is not something which cannot be addressed. A bit of effort from your side in the form of lifestyle modifications, a little bit of exercise, a balanced work life equation can do wonders and help you get rid of Chronic Stress Syndrome.  Get Proper Sleep: It has been proved that Chronic   Stress Syndrome causes loss of sleep where an individual may not even be able to sleep for days unending. This may result in the  individual feeling lethargic and unable to focus at work the following morning. This may lead to decreased performance at work. Thus, it is important to have a good sleep-wake cycle. For this, try and not drink any caffeinated beverage about four hours prior to going to sleep, as caffeine pumps up the adrenaline and causes you to stay awake resulting ultimately in Chronic Stress Syndrome.  Avoid Alcohol and Drugs: Another way to get rid of Chronic Stress Syndrome is lifestyle modifications. Stay away from alcohol and other recreational drugs. Take Short Frequent Breaks at Work: Try to take frequent breaks from work and do not work continuously. Try and manage your work in such a way that you even meet your deadline and come home on time for a happy dinner with family. A good time spent with family and kids does wonders in not only dealing with Chronic Stress Syndrome but also preventing it.  Become Physically Active: Another step towards getting rid of Chronic Stress Syndrome is physical activity. If you do not have time to spend at the gym then at least try and go for daily walks for about half an hour a day which not only keeps the stress away but also is good for your overall health. Physical activity leads to production of endorphins which will make you feel relaxed and feel good.  Healthy Diet Can Help You Deal With Chronic Stress Syndrome: Have a balanced and healthy diet is another step towards a stress free life and keeping Chronic Stress Syndrome at [place]. If time is a constraint then you can try eating three small meals a day. Try and avoid fast foods and take foods which are healthy and rich in proteins, fiber, and carbohydrates to boost your energy system.  Music Can Soothe Your Mind: Light music is one of the best and most effective relaxation techniques that one can try to overcome stress. It has shown to calm down the mind and take you away from all the stressors that you may be having. These  days it is also being used as a therapy in some institutes for overcoming stress. It is important here to discuss the importance of a good social support system for patients with Chronic Stress Syndrome, as a good social support framework can do wonders in taking the stress away from the patient and overcoming Chronic Stress Syndrome.  Meditation Can Help You Deal With Chronic Stress Syndrome Effectively: Meditation and yoga has also shown to be quite effective in relaxing the mind and coping up with Chronic Stress Syndrome   In cases where these measures are not helpful, then it is time for you to consult with a skilled psychologist or a psychiatrist for potential therapies or medications to control the stress response.   The psychologist can help you with a variety of steps for coping up with Chronic Stress Syndrome. Relaxation techniques and behavioral therapy are some of the methods employed by psychologists. In some cases, medications can also be given to help relax the patient.  Since Chronic Stress Syndrome is both emotionally and physically draining for the patient and it also adversely affects the family life of the patient hence it is important for the patient to recognize the condition and taking steps to cope up with it. Escaping measures like alcohol and drug use are of no help as they only  aggravate the condition apart from their other health hazards. If this condition is ignored or left untreated it can lead to various medical conditions like anxiety and depression and various other medical conditions.  Last but not least, smile as often as you can as it is the best gift that you can give to someone. The best way to stay relaxed is to have a good smile, exercise daily, spend time with your family, meditation and if required consultation with a good psychologist so that you can live a stress free life and overcome the symptoms of Chronic Stress Syndrome.   -Also we need to transition  your brain into thinking more positively.  These tasks below are some things I want you to do every day 1)  write 3 new things that you are grateful for every day for 21 days  2)  exercise daily- walk for 15 minutes twice a day every day 3)  you are going to journal every day about one positive experience that you had 4)  meditate every day.  You can go on YouTube and look for 15-minute relaxation meditation or what ever.  But we need to make sure that you are in the moment and relaxing and deep breathing every day 5)  Write 1 positive email every day to praise someone in your life     - If you have insomnia or difficulty sleeping, this information is for you:  - Avoid caffeinated beverages after lunch,  no alcoholic beverages,  no eating within 2-3 hours of lying down,  avoid exposure to blue light before bed,  avoid daytime naps, and  needs to maintain a regular sleep schedule- go to sleep and wake up around the same time every night.   - Resolve concerns or worries before entering bedroom:  Discussed relaxation techniques with patient and to keep a journal to write down fears\ worries.  I suggested seeing a counselor for CBT.   - Recommend patient meditate or do deep breathing exercises to help relax.   Incorporate the use of white noise machines or listen to "sleep meditation music", or recordings of guided meditations for sleep from YouTube which are free, such as  "guided meditation for detachment from over thinking"  by Ina Kick.

## 2020-02-20 ENCOUNTER — Encounter: Payer: BC Managed Care – PPO | Admitting: Physician Assistant

## 2022-01-13 ENCOUNTER — Ambulatory Visit (INDEPENDENT_AMBULATORY_CARE_PROVIDER_SITE_OTHER): Payer: BLUE CROSS/BLUE SHIELD | Admitting: Nurse Practitioner

## 2022-01-13 ENCOUNTER — Encounter: Payer: Self-pay | Admitting: Nurse Practitioner

## 2022-01-13 ENCOUNTER — Other Ambulatory Visit (HOSPITAL_COMMUNITY)
Admission: RE | Admit: 2022-01-13 | Discharge: 2022-01-13 | Disposition: A | Discharge status: Home / Self Care | Payer: BLUE CROSS/BLUE SHIELD | Source: Ambulatory Visit | Attending: Nurse Practitioner | Admitting: Nurse Practitioner

## 2022-01-13 VITALS — BP 122/76 | HR 88 | Ht 61.0 in | Wt 193.0 lb

## 2022-01-13 DIAGNOSIS — Z23 Encounter for immunization: Secondary | ICD-10-CM | POA: Diagnosis not present

## 2022-01-13 DIAGNOSIS — Z01419 Encounter for gynecological examination (general) (routine) without abnormal findings: Secondary | ICD-10-CM

## 2022-01-13 DIAGNOSIS — Z7185 Encounter for immunization safety counseling: Secondary | ICD-10-CM

## 2022-01-13 NOTE — Progress Notes (Signed)
   Holly Velez 2000/09/06 [id number]   History:  21 y.o. G0 presents as new patient to establish care. Monthly cycles. Has not received Gardasil series.   Gynecologic History Patient's last menstrual period was 12/23/2021. Period Cycle (Days): 28 Period Duration (Days): 4 Period Pattern: Regular Menstrual Flow: Heavy, Moderate Menstrual Control: Maxi pad Menstrual Control Change Freq (Hours): changes pad 4-5 times a day Dysmenorrhea: (!) Moderate Dysmenorrhea Symptoms: Cramping, Nausea, Other (Comment), Headache (lower back pain) Contraception/Family planning: abstinence Sexually active: Never  Health Maintenance Last Pap: Never Last mammogram: Not indicated Last colonoscopy: Not indicated  Last Dexa: Not indicated   Past medical history, past surgical history, family history and social history were all reviewed and documented in the EPIC chart. Works at [employer].   ROS:  A ROS was performed and pertinent positives and negatives are included.  Exam:  Vitals:   01/13/22 1050  BP: 122/76  Pulse: 88  Weight: 193 lb (87.5 kg)  Height: 5\' 1"  (1.549 m)   Body mass index is 36.47 kg/m.  General appearance:  Normal Thyroid:  Symmetrical, normal in size, without palpable masses or nodularity. Respiratory  Auscultation:  Clear without wheezing or rhonchi Cardiovascular  Auscultation:  Regular rate, without rubs, murmurs or gallops  Edema/varicosities:  Not grossly evident Abdominal  Soft,nontender, without masses, guarding or rebound.  Liver/spleen:  No organomegaly noted  Hernia:  None appreciated  Skin  Inspection:  Grossly normal Breasts: Not indicated per guidelines Genitourinary   Inguinal/mons:  Normal without inguinal adenopathy  External genitalia:  Normal appearing vulva with no masses, tenderness, or lesions  BUS/Urethra/Skene's glands:  Normal  Vagina:  Normal appearing with normal color and discharge, no lesions  Cervix:  Normal  appearing without discharge or lesions  Uterus:  Normal in size, shape and contour.  Midline and mobile, nontender  Adnexa/parametria:     Rt: Normal in size, without masses or tenderness.   Lt: Normal in size, without masses or tenderness.  Anus and perineum: Normal  Digital rectal exam: Not indicated  Patient informed chaperone available to be present for breast and pelvic exam. Patient has requested no chaperone to be present. Patient has been advised what will be completed during breast and pelvic exam.   Assessment/Plan:  21 y.o. G0 to establish care.   Well female exam with routine gynecological exam - Plan: Cytology - PAP( [place]). Education provided on SBEs, importance of preventative screenings, current guidelines, high calcium diet, regular exercise, and multivitamin daily.   Vaccine counseling - Has not received Gardasil series and is interested. First dose today.   Need for HPV vaccine - Plan: HPV 9-valent vaccine,Recombinat. Will return in 2 months for second dose.   Screening for cervical cancer - Initial pap today.   Return in 1 year for annual.      Tamela Gammon DNP, 11:14 AM 01/13/2022

## 2022-01-18 ENCOUNTER — Other Ambulatory Visit: Payer: Self-pay | Admitting: Nurse Practitioner

## 2022-01-18 DIAGNOSIS — B3731 Acute candidiasis of vulva and vagina: Secondary | ICD-10-CM

## 2022-01-18 LAB — CYTOLOGY - PAP: Diagnosis: NEGATIVE

## 2022-01-18 MED ORDER — FLUCONAZOLE 150 MG PO TABS: 150.0000 mg | ORAL_TABLET | ORAL | 0 refills | Status: AC

## 2022-03-16 ENCOUNTER — Ambulatory Visit (INDEPENDENT_AMBULATORY_CARE_PROVIDER_SITE_OTHER): Payer: BLUE CROSS/BLUE SHIELD | Admitting: *Deleted

## 2022-03-16 DIAGNOSIS — Z23 Encounter for immunization: Secondary | ICD-10-CM | POA: Diagnosis not present

## 2022-07-19 ENCOUNTER — Ambulatory Visit (INDEPENDENT_AMBULATORY_CARE_PROVIDER_SITE_OTHER): Payer: BLUE CROSS/BLUE SHIELD

## 2022-07-19 DIAGNOSIS — Z23 Encounter for immunization: Secondary | ICD-10-CM

## 2022-07-19 NOTE — Progress Notes (Signed)
Gardasil vaccination given IM Left deltoid.  Patient tolerated injection well.

## 2023-08-16 ENCOUNTER — Ambulatory Visit (INDEPENDENT_AMBULATORY_CARE_PROVIDER_SITE_OTHER): Payer: BLUE CROSS/BLUE SHIELD | Admitting: Family Medicine

## 2023-08-16 ENCOUNTER — Encounter: Payer: Self-pay | Admitting: Family Medicine

## 2023-08-16 VITALS — BP 120/78 | HR 57 | Ht 61.0 in | Wt 188.4 lb

## 2023-08-16 DIAGNOSIS — Z13220 Encounter for screening for lipoid disorders: Secondary | ICD-10-CM | POA: Diagnosis not present

## 2023-08-16 DIAGNOSIS — Z0000 Encounter for general adult medical examination without abnormal findings: Secondary | ICD-10-CM | POA: Diagnosis not present

## 2023-08-16 DIAGNOSIS — Z114 Encounter for screening for human immunodeficiency virus [HIV]: Secondary | ICD-10-CM

## 2023-08-16 DIAGNOSIS — Z130 Encounter for screening for diseases of the blood and blood-forming organs and certain disorders involving the immune mechanism: Secondary | ICD-10-CM | POA: Diagnosis not present

## 2023-08-16 DIAGNOSIS — Z1159 Encounter for screening for other viral diseases: Secondary | ICD-10-CM | POA: Diagnosis not present

## 2023-08-16 DIAGNOSIS — Z7689 Persons encountering health services in other specified circumstances: Secondary | ICD-10-CM

## 2023-08-17 ENCOUNTER — Encounter: Payer: Self-pay | Admitting: Family Medicine

## 2023-08-17 NOTE — Progress Notes (Signed)
 New Patient Office Visit  Subjective    Patient ID: Holly Velez, female    DOB: [birth year]  Age: 23 y.o. MRN: [id number]  CC:  Chief Complaint  Patient presents with   New Patient (Initial Visit)    Well check with blood work    HPI Holly Velez presents to establish care and for routine annual exam. Patient denies known chronic med issues or taking meds. Patient denies acute complaints.    Outpatient Encounter Medications as of 08/16/2023  Medication Sig   azelastine  (ASTELIN ) 0.1 % nasal spray 1 spray each nostril twice daily after neil med sinus rinses for allergies (Patient not taking: Reported on 08/16/2023)   fluconazole  (DIFLUCAN ) 150 MG tablet Take 1 tablet (150 mg total) by mouth every 3 (three) days. (Patient not taking: Reported on 08/16/2023)   Multiple Vitamin (MULTIVITAMIN) tablet Take 1 tablet by mouth daily. (Patient not taking: Reported on 08/16/2023)   No facility-administered encounter medications on file as of 08/16/2023.    Past Medical History:  Diagnosis Date   Allergy    Eczema     No past surgical history on file.  Family History  Problem Relation Age of Onset   Hyperlipidemia Mother    Hypertension Father    Diabetes Maternal Grandmother     Social History   Socioeconomic History   Marital status: Single    Spouse name: Not on file   Number of children: Not on file   Years of education: Not on file   Highest education level: Not on file  Occupational History   Not on file  Tobacco Use   Smoking status: Never    Passive exposure: Past   Smokeless tobacco: Never  Vaping Use   Vaping status: Some Days  Substance and Sexual Activity   Alcohol use: No   Drug use: Never   Sexual activity: Never    Birth control/protection: None  Other Topics Concern   Not on file  Social History Narrative   Not on file   Social Drivers of Health   Financial Resource Strain: Not on file  Food Insecurity: Not on file  Transportation  Needs: Not on file  Physical Activity: Not on file  Stress: Not on file  Social Connections: Not on file  Intimate Partner Violence: Not on file    Review of Systems  All other systems reviewed and are negative.       Objective   BP 120/78 (BP Location: Right Arm, Patient Position: Sitting, Cuff Size: Normal)   Pulse (!) 57   Ht 5\' 1"  (1.549 m)   Wt 188 lb 6.4 oz (85.5 kg)   LMP 08/07/2023   SpO2 98%   BMI 35.60 kg/m   Physical Exam Vitals and nursing note reviewed.  Constitutional:      General: Holly Velez is not in acute distress.    Appearance: Holly Velez is obese.  HENT:     Head: Normocephalic and atraumatic.     Right Ear: Tympanic membrane, ear canal and external ear normal.     Left Ear: Tympanic membrane, ear canal and external ear normal.     Nose: Nose normal.     Mouth/Throat:     Mouth: Mucous membranes are moist.     Pharynx: Oropharynx is clear.  Eyes:     Conjunctiva/sclera: Conjunctivae normal.     Pupils: Pupils are equal, round, and reactive to light.  Neck:     Thyroid: No thyromegaly.  Cardiovascular:  Rate and Rhythm: Normal rate and regular rhythm.     Heart sounds: Normal heart sounds. No murmur heard. Pulmonary:     Effort: Pulmonary effort is normal. No respiratory distress.     Breath sounds: Normal breath sounds.  Abdominal:     General: There is no distension.     Palpations: Abdomen is soft. There is no mass.     Tenderness: There is no abdominal tenderness.  Musculoskeletal:        General: Normal range of motion.     Cervical back: Normal range of motion and neck supple.  Skin:    General: Skin is warm and dry.  Neurological:     General: No focal deficit present.     Mental Status: Holly Velez is alert and oriented to person, place, and time.  Psychiatric:        Mood and Affect: Mood normal.        Behavior: Behavior normal.         Assessment & Plan:   Annual physical exam -     CMP14+EGFR  Screening for deficiency anemia -      CBC with Differential/Platelet  Screening for lipid disorders -     Lipid panel  Screening for HIV (human immunodeficiency virus) -     HIV Antibody (routine testing w rflx)  Need for hepatitis C screening test -     Hepatitis C antibody  Encounter to establish care     No follow-ups on file.   Arlo Lama, MD

## 2023-08-19 LAB — CBC WITH DIFFERENTIAL/PLATELET
Basophils Absolute: 0 10*3/uL (ref 0.0–0.2)
Basos: 1 %
EOS (ABSOLUTE): 0.4 10*3/uL (ref 0.0–0.4)
Eos: 5 %
Hematocrit: 41.3 % (ref 34.0–46.6)
Hemoglobin: 13 g/dL (ref 11.1–15.9)
Immature Grans (Abs): 0 10*3/uL (ref 0.0–0.1)
Immature Granulocytes: 0 %
Lymphocytes Absolute: 2.6 10*3/uL (ref 0.7–3.1)
Lymphs: 30 %
MCH: 28.4 pg (ref 26.6–33.0)
MCHC: 31.5 g/dL (ref 31.5–35.7)
MCV: 90 fL (ref 79–97)
Monocytes Absolute: 0.5 10*3/uL (ref 0.1–0.9)
Monocytes: 6 %
Neutrophils Absolute: 5.1 10*3/uL (ref 1.4–7.0)
Neutrophils: 58 %
Platelets: 387 10*3/uL (ref 150–450)
RBC: 4.58 x10E6/uL (ref 3.77–5.28)
RDW: 12 % (ref 11.7–15.4)
WBC: 8.7 10*3/uL (ref 3.4–10.8)

## 2023-08-19 LAB — CMP14+EGFR
ALT: 15 IU/L (ref 0–32)
AST: 20 IU/L (ref 0–40)
Albumin: 4.2 g/dL (ref 4.0–5.0)
Alkaline Phosphatase: 68 IU/L (ref 44–121)
BUN/Creatinine Ratio: 13 (ref 9–23)
BUN: 9 mg/dL (ref 6–20)
Bilirubin Total: 0.7 mg/dL (ref 0.0–1.2)
CO2: 25 mmol/L (ref 20–29)
Calcium: 9.1 mg/dL (ref 8.7–10.2)
Chloride: 101 mmol/L (ref 96–106)
Creatinine, Ser: 0.68 mg/dL (ref 0.57–1.00)
Globulin, Total: 2.3 g/dL (ref 1.5–4.5)
Glucose: 88 mg/dL (ref 70–99)
Potassium: 4.8 mmol/L (ref 3.5–5.2)
Sodium: 140 mmol/L (ref 134–144)
Total Protein: 6.5 g/dL (ref 6.0–8.5)
eGFR: 125 mL/min/{1.73_m2} (ref >59)

## 2023-08-19 LAB — LIPID PANEL
Chol/HDL Ratio: 4.8 ratio — ABNORMAL HIGH (ref 0.0–4.4)
Cholesterol, Total: 200 mg/dL — ABNORMAL HIGH (ref 100–199)
HDL: 42 mg/dL (ref >39)
LDL Chol Calc (NIH): 137 mg/dL — ABNORMAL HIGH (ref 0–99)
Triglycerides: 114 mg/dL (ref 0–149)
VLDL Cholesterol Cal: 21 mg/dL (ref 5–40)

## 2023-08-19 LAB — HIV ANTIBODY (ROUTINE TESTING W REFLEX): HIV Screen 4th Generation wRfx: NONREACTIVE

## 2023-08-19 LAB — HEPATITIS C ANTIBODY: Hep C Virus Ab: NONREACTIVE

## 2023-08-23 ENCOUNTER — Ambulatory Visit: Payer: Self-pay | Admitting: Family Medicine

## 2023-10-12 DIAGNOSIS — F411 Generalized anxiety disorder: Secondary | ICD-10-CM | POA: Diagnosis not present

## 2023-10-19 DIAGNOSIS — F411 Generalized anxiety disorder: Secondary | ICD-10-CM | POA: Diagnosis not present
